# Patient Record
Sex: Female | Born: 1995 | Race: White | Hispanic: No | Marital: Single | Smoking: Current every day smoker
Health system: Southern US, Community
[De-identification: ages and names within clinical notes are randomized; demographics above are authoritative.]

---
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
## 2015-06-19 ENCOUNTER — Emergency Department (HOSPITAL_COMMUNITY)
Admission: EM | Admit: 2015-06-19 | Discharge: 2015-06-19 | Disposition: A | Discharge status: Home / Self Care | Payer: 59 | Attending: Emergency Medicine | Admitting: Emergency Medicine

## 2015-06-19 ENCOUNTER — Encounter (HOSPITAL_COMMUNITY): Payer: Self-pay | Admitting: *Deleted

## 2015-06-19 DIAGNOSIS — K0889 Other specified disorders of teeth and supporting structures: Secondary | ICD-10-CM | POA: Insufficient documentation

## 2015-06-19 DIAGNOSIS — F17210 Nicotine dependence, cigarettes, uncomplicated: Secondary | ICD-10-CM | POA: Insufficient documentation

## 2015-06-19 DIAGNOSIS — K029 Dental caries, unspecified: Secondary | ICD-10-CM | POA: Diagnosis not present

## 2015-06-19 MED ORDER — TRAMADOL HCL 50 MG PO TABS: 50.0000 mg | ORAL_TABLET | Freq: Four times a day (QID) | ORAL | Status: AC | PRN

## 2015-06-19 MED ORDER — CLINDAMYCIN HCL 150 MG PO CAPS: 300.0000 mg | ORAL_CAPSULE | Freq: Four times a day (QID) | ORAL | Status: AC

## 2015-06-19 NOTE — ED Notes (Signed)
Pt's VS taken in Triage. T Triplett, EDPa out to see pt in Triage.

## 2015-06-19 NOTE — ED Provider Notes (Signed)
CSN: [id number]     Arrival date & time 06/19/15  1424 History  By signing my name below, I, Terrance Branch, attest that this documentation has been prepared under the direction and in the presence of Marshella Tello, PA-C. Electronically Signed: Evon Slackerrance Branch, ED Scribe. 06/19/2015. 5:29 PM.   Chief Complaint  Patient presents with  . Dental Pain   Patient is a 20 y.o. female presenting with tooth pain. The history is provided by the patient. No language interpreter was used.  Dental Pain Associated symptoms: no congestion, no facial swelling, no fever, no headaches and no neck pain    HPI Comments: Brenda Montes is a 20 y.o. female who presents to the Emergency Department complaining of sudden upper left sided dental pain onset 2 days prior. Pt reports hx of poor dentition. Pt states that the pain is worse when trying to drink or eat anything. Pt states that she has tried tylenol and motrin with no relief. Pt doesn't report fever, nausea or vomiting, neck pain, or facial swelling.  History reviewed. No pertinent past medical history. History reviewed. No pertinent past surgical history. History reviewed. No pertinent family history. Social History  Substance Use Topics  . Smoking status: Current Every Day Smoker -- 1.00 packs/day    Types: Cigarettes  . Smokeless tobacco: None  . Alcohol Use: No   OB History    No data available      Review of Systems  Constitutional: Negative for fever and appetite change.  HENT: Positive for dental problem. Negative for congestion, facial swelling, sore throat and trouble swallowing.   Eyes: Negative for pain and visual disturbance.  Musculoskeletal: Negative for neck pain and neck stiffness.  Neurological: Negative for dizziness, facial asymmetry and headaches.  Hematological: Negative for adenopathy.    Allergies  Review of patient's allergies indicates no known allergies.  Home Medications   Prior to Admission medications   Not on  File   BP 146/96 mmHg  Pulse 83  Temp(Src) 99.2 F (37.3 C) (Oral)  Resp 18  Ht 5\' 3"  (1.6 m)  Wt 180 lb (81.647 kg)  BMI 31.89 kg/m2  SpO2 100%  LMP 06/11/2015   Physical Exam  Constitutional: She is oriented to person, place, and time. She appears well-developed and well-nourished. No distress.  HENT:  Head: Normocephalic and atraumatic.  Mouth/Throat: Uvula is midline, oropharynx is clear and moist and mucous membranes are normal. No trismus in the jaw. Dental caries present. No uvula swelling.  Wide spread dental carries and poor dentition, no obvious abscess. Tenderness at lateral left upper incisor.   Eyes: Conjunctivae and EOM are normal.  Neck: Neck supple. No tracheal deviation present.  Cardiovascular: Normal rate, regular rhythm and normal heart sounds.   Pulmonary/Chest: Effort normal and breath sounds normal. No respiratory distress.  Musculoskeletal: Normal range of motion.  Neurological: She is alert and oriented to person, place, and time.  Skin: Skin is warm and dry.  Psychiatric: She has a normal mood and affect. Her behavior is normal.  Nursing note and vitals reviewed.   ED Course  Procedures (including critical care time) DIAGNOSTIC STUDIES: Oxygen Saturation is 100% on RA, normal by my interpretation.    COORDINATION OF CARE: 5:31 PM-Discussed treatment plan with pt at bedside and pt agreed to plan.     Labs Review Labs Reviewed - No data to display  Imaging Review No results found.    EKG Interpretation None      MDM  Final diagnoses:  Pain, dental   Pt is well appearing, airway patent.  No concerning sx's for Ludwig's angina, no dental abscess.     I personally performed the services described in this documentation, which was scribed in my presence. The recorded information has been reviewed and is accurate.      Pauline Aus, PA-C 06/23/15 2045  Bethann Berkshire, MD 06/26/15 [phone]

## 2015-06-19 NOTE — ED Notes (Signed)
Pt reports dental pain x 2 days. 

## 2015-11-19 ENCOUNTER — Emergency Department (HOSPITAL_COMMUNITY)
Admission: EM | Admit: 2015-11-19 | Discharge: 2015-11-19 | Disposition: A | Discharge status: Home / Self Care | Payer: 59 | Attending: Emergency Medicine | Admitting: Emergency Medicine

## 2015-11-19 ENCOUNTER — Encounter (HOSPITAL_COMMUNITY): Payer: Self-pay | Admitting: Emergency Medicine

## 2015-11-19 ENCOUNTER — Emergency Department (HOSPITAL_COMMUNITY): Payer: 59

## 2015-11-19 DIAGNOSIS — F17210 Nicotine dependence, cigarettes, uncomplicated: Secondary | ICD-10-CM | POA: Insufficient documentation

## 2015-11-19 DIAGNOSIS — M25572 Pain in left ankle and joints of left foot: Secondary | ICD-10-CM

## 2015-11-19 DIAGNOSIS — W208XXA Other cause of strike by thrown, projected or falling object, initial encounter: Secondary | ICD-10-CM | POA: Insufficient documentation

## 2015-11-19 DIAGNOSIS — Y929 Unspecified place or not applicable: Secondary | ICD-10-CM | POA: Insufficient documentation

## 2015-11-19 DIAGNOSIS — Y990 Civilian activity done for income or pay: Secondary | ICD-10-CM | POA: Insufficient documentation

## 2015-11-19 DIAGNOSIS — Y9389 Activity, other specified: Secondary | ICD-10-CM | POA: Insufficient documentation

## 2015-11-19 MED ORDER — HYDROCODONE-ACETAMINOPHEN 5-325 MG PO TABS
1.0000 | ORAL_TABLET | Freq: Once | ORAL | Status: AC
  Administered 2015-11-19: 1 via ORAL
  Filled 2015-11-19: qty 1

## 2015-11-19 MED ORDER — NAPROXEN 500 MG PO TABS: 500.0000 mg | ORAL_TABLET | Freq: Two times a day (BID) | ORAL | Status: AC
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
## 2015-11-19 NOTE — Discharge Instructions (Signed)
1. Medications: alternate naprosyn and tylenol for pain control, usual home medications 2. Treatment: rest, ice, elevate and use brace, drink plenty of fluids, gentle stretching 3. Follow Up: Please followup with orthopedics as directed or your PCP in 1 week if no improvement for discussion of your diagnoses and further evaluation after today's visit; if you do not have a primary care doctor use the resource guide provided to find one; Please return to the ER for worsening symptoms or other concerns    Ankle Pain Ankle pain is a common symptom. The bones, cartilage, tendons, and muscles of the ankle joint perform a lot of work each day. The ankle joint holds your body weight and allows you to move around. Ankle pain can occur on either side or back of 1 or both ankles. Ankle pain may be sharp and burning or dull and aching. There may be tenderness, stiffness, redness, or warmth around the ankle. The pain occurs more often when a person walks or puts pressure on the ankle. CAUSES  There are many reasons ankle pain can develop. It is important to work with your caregiver to identify the cause since many conditions can impact the bones, cartilage, muscles, and tendons. Causes for ankle pain include:  Injury, including a break (fracture), sprain, or strain often due to a fall, sports, or a high-impact activity.  Swelling (inflammation) of a tendon (tendonitis).  Achilles tendon rupture.  Ankle instability after repeated sprains and strains.  Poor foot alignment.  Pressure on a nerve (tarsal tunnel syndrome).  Arthritis in the ankle or the lining of the ankle.  Crystal formation in the ankle (gout or pseudogout). DIAGNOSIS  A diagnosis is based on your medical history, your symptoms, results of your physical exam, and results of diagnostic tests. Diagnostic tests may include X-ray exams or a computerized magnetic scan (magnetic resonance imaging, MRI). TREATMENT  Treatment will depend on the  cause of your ankle pain and may include:  Keeping pressure off the ankle and limiting activities.  Using crutches or other walking support (a cane or brace).  Using rest, ice, compression, and elevation.  Participating in physical therapy or home exercises.  Wearing shoe inserts or special shoes.  Losing weight.  Taking medications to reduce pain or swelling or receiving an injection.  Undergoing surgery. HOME CARE INSTRUCTIONS   Only take over-the-counter or prescription medicines for pain, discomfort, or fever as directed by your caregiver.  Put ice on the injured area.  Put ice in a plastic bag.  Place a towel between your skin and the bag.  Leave the ice on for 15-20 minutes at a time, 03-04 times a day.  Keep your leg raised (elevated) when possible to lessen swelling.  Avoid activities that cause ankle pain.  Follow specific exercises as directed by your caregiver.  Record how often you have ankle pain, the location of the pain, and what it feels like. This information may be helpful to you and your caregiver.  Ask your caregiver about returning to work or sports and whether you should drive.  Follow up with your caregiver for further examination, therapy, or testing as directed. SEEK MEDICAL CARE IF:   Pain or swelling continues or worsens beyond 1 week.  You have an oral temperature above 102 F (38.9 C).  You are feeling unwell or have chills.  You are having an increasingly difficult time with walking.  You have loss of sensation or other new symptoms.  You have questions or concerns.  MAKE SURE YOU:   Understand these instructions.  Will watch your condition.  Will get help right away if you are not doing well or get worse.   This information is not intended to replace advice given to you by your health care provider. Make sure you discuss any questions you have with your health care provider.   Document Released: 11/21/2009 Document Revised:  08/26/2011 Document Reviewed: 01/03/2015 Elsevier Interactive Patient Education [employer]2016 [employer]
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
